# Patient Record
Sex: Male | Born: 1960 | Race: Black or African American | Hispanic: No | Marital: Married | Smoking: Current some day smoker
Health system: Southern US, Community
[De-identification: ages and names within clinical notes are randomized; demographics above are authoritative.]

## PROBLEM LIST (undated history)

## (undated) DIAGNOSIS — E785 Hyperlipidemia, unspecified: Secondary | ICD-10-CM

## (undated) DIAGNOSIS — E119 Type 2 diabetes mellitus without complications: Secondary | ICD-10-CM

## (undated) DIAGNOSIS — I10 Essential (primary) hypertension: Secondary | ICD-10-CM

## (undated) HISTORY — PX: ANKLE SURGERY: SHX546

---

## 2001-06-29 ENCOUNTER — Emergency Department (HOSPITAL_COMMUNITY): Admission: EM | Admit: 2001-06-29 | Discharge: 2001-06-29 | Payer: Self-pay | Admitting: *Deleted

## 2001-06-29 ENCOUNTER — Encounter: Payer: Self-pay | Admitting: *Deleted

## 2002-03-03 ENCOUNTER — Emergency Department (HOSPITAL_COMMUNITY): Admission: EM | Admit: 2002-03-03 | Discharge: 2002-03-03 | Payer: Self-pay | Admitting: Emergency Medicine

## 2002-03-03 ENCOUNTER — Encounter: Payer: Self-pay | Admitting: Emergency Medicine

## 2005-12-05 ENCOUNTER — Emergency Department: Payer: Self-pay | Admitting: Emergency Medicine

## 2008-02-10 ENCOUNTER — Emergency Department (HOSPITAL_COMMUNITY): Admission: EM | Admit: 2008-02-10 | Discharge: 2008-02-11 | Payer: Self-pay | Admitting: Emergency Medicine

## 2008-04-20 ENCOUNTER — Inpatient Hospital Stay: Payer: Self-pay | Admitting: Surgery

## 2008-08-23 ENCOUNTER — Emergency Department: Payer: Self-pay | Admitting: Emergency Medicine

## 2008-08-25 ENCOUNTER — Emergency Department: Payer: Self-pay | Admitting: Unknown Physician Specialty

## 2008-09-22 ENCOUNTER — Emergency Department: Payer: Self-pay | Admitting: Emergency Medicine

## 2008-12-20 ENCOUNTER — Emergency Department (HOSPITAL_COMMUNITY): Admission: EM | Admit: 2008-12-20 | Discharge: 2008-12-20 | Payer: Self-pay | Admitting: Emergency Medicine

## 2008-12-22 ENCOUNTER — Emergency Department (HOSPITAL_COMMUNITY): Admission: EM | Admit: 2008-12-22 | Discharge: 2008-12-22 | Payer: Self-pay | Admitting: Emergency Medicine

## 2009-01-31 ENCOUNTER — Emergency Department: Payer: Self-pay | Admitting: Emergency Medicine

## 2009-02-05 ENCOUNTER — Emergency Department: Payer: Self-pay | Admitting: Emergency Medicine

## 2009-04-27 ENCOUNTER — Emergency Department: Payer: Self-pay | Admitting: Unknown Physician Specialty

## 2010-06-22 LAB — CULTURE, ROUTINE-ABSCESS

## 2011-08-04 ENCOUNTER — Encounter (HOSPITAL_COMMUNITY): Payer: Self-pay | Admitting: *Deleted

## 2011-08-04 ENCOUNTER — Emergency Department (HOSPITAL_COMMUNITY)
Admission: EM | Admit: 2011-08-04 | Discharge: 2011-08-04 | Disposition: A | Discharge status: Home / Self Care | Payer: Self-pay | Attending: Emergency Medicine | Admitting: Emergency Medicine

## 2011-08-04 DIAGNOSIS — S2095XA Superficial foreign body of unspecified parts of thorax, initial encounter: Secondary | ICD-10-CM | POA: Insufficient documentation

## 2011-08-04 DIAGNOSIS — T148XXA Other injury of unspecified body region, initial encounter: Secondary | ICD-10-CM

## 2011-08-04 HISTORY — DX: Essential (primary) hypertension: I10

## 2011-08-04 MED ORDER — LIDOCAINE HCL (PF) 1 % IJ SOLN
INTRAMUSCULAR | Status: AC
  Administered 2011-08-04: 05:00:00
  Filled 2011-08-04: qty 5

## 2011-08-04 NOTE — ED Notes (Signed)
Pt alert & oriented x4, stable gait. Pt given discharge instructions, paperwork & prescription(s). Patient instructed to stop at the registration desk to finish any additional paperwork. pt verbalized understanding. Pt left department w/ no further questions.  

## 2011-08-04 NOTE — Discharge Instructions (Signed)
Wash your penis with warm soap and water, apply antibiotic cream to the tick bite site. See your doctor if you develop fevers, headache and a rash.

## 2011-08-04 NOTE — ED Notes (Signed)
Pt states woke this morning to find tick, pt removed wants to make sure he got all of it.

## 2011-08-04 NOTE — ED Provider Notes (Signed)
History     CSN: [id number]  Arrival date & time 08/04/11  0406   First MD Initiated Contact with Patient 08/04/11 [phone]      Chief Complaint  Patient presents with  . Insect Bite    (Consider location/radiation/quality/duration/timing/severity/associated sxs/prior treatment) HPI Comments: The patient awoke this morning finding a tick on the head of his penis. He is unsure when it latched on, he was able to pull off the body but there was some retained foreign body in the head of the penis. There is minimal pain associated with this, no fevers, symptoms are persistent, mild, nothing makes this better or worse.  The history is provided by the patient.    Past Medical History  Diagnosis Date  . Hypertension     Past Surgical History  Procedure Date  . Ankle surgery     left    History reviewed. No pertinent family history.  History  Substance Use Topics  . Smoking status: Current Some Day Smoker -- 0.5 packs/day    Types: Cigarettes  . Smokeless tobacco: Not on file  . Alcohol Use: No      Review of Systems  Constitutional: Negative for fever.  Skin: Negative for rash.    Allergies  Review of patient's allergies indicates no known allergies.  Home Medications   Current Outpatient Rx  Name Route Sig Dispense Refill  . ACETAMINOPHEN 325 MG PO TABS Oral Take 650 mg by mouth daily.    Marland Kitchen CETIRIZINE HCL 10 MG PO TABS Oral Take 10 mg by mouth daily.    Marland Kitchen FOLIC ACID 1 MG PO TABS Oral Take 1 mg by mouth daily.    . IBUPROFEN 800 MG PO TABS Oral Take 800 mg by mouth daily.    . MULTI-VITAMIN/MINERALS PO TABS Oral Take 1 tablet by mouth daily.    . THIAMINE HCL 50 MG PO TABS Oral Take 50 mg by mouth daily.      BP 140/85  Pulse 89  Temp(Src) 98.4 F (36.9 C) (Oral)  Resp 20  Ht 5\' 10"  (1.778 m)  Wt 250 lb (113.399 kg)  BMI 35.87 kg/m2  SpO2 95%  Physical Exam  Constitutional: He appears well-developed and well-nourished.  HENT:  Head: Normocephalic and  atraumatic.  Mouth/Throat: Oropharynx is clear and moist.  Eyes: Conjunctivae are normal. No scleral icterus.  Pulmonary/Chest: Effort normal.  Musculoskeletal: He exhibits no edema and no tenderness.  Neurological: He is alert. Coordination normal.  Skin: Skin is warm and dry. No rash noted.       The head of the penis shows a very small embedded insect head. No surrounding redness or swelling    ED Course  FOREIGN BODY REMOVAL Date/Time: 08/04/2011 4:50 AM Performed by: Eber Hong D Authorized by: Eber Hong D Consent: Verbal consent obtained. Risks and benefits: risks, benefits and alternatives were discussed Consent given by: patient Patient understanding: patient states understanding of the procedure being performed Patient identity confirmed: verbally with patient Time out: Immediately prior to procedure a "time out" was called to verify the correct patient, procedure, equipment, support staff and site/side marked as required. Intake: Head of the penis. Anesthesia: nerve block Local anesthetic: lidocaine 1% without epinephrine Anesthetic total: 4 ml Patient sedated: no Patient restrained: no Patient cooperative: yes Complexity: simple 1 objects recovered. Objects recovered: Tick head Post-procedure assessment: foreign body removed Patient tolerance: Patient tolerated the procedure well with no immediate complications.   (including critical care time)  Labs Reviewed -  No data to display No results found.   1. Foreign body in skin       MDM  Manual extraction of tick head required, see foreign body removal note above   Patient given followup instructions regarding possible [place] spotted fever or other tickborne illness, understanding expressed, cautioned to wash with warm soapy water twice today. Antibiotic cream and dressing.     Vida Roller, MD 08/04/11 [phone]

## 2014-04-24 ENCOUNTER — Emergency Department (HOSPITAL_COMMUNITY): Payer: Non-veteran care

## 2014-04-24 ENCOUNTER — Encounter (HOSPITAL_COMMUNITY): Payer: Self-pay | Admitting: Emergency Medicine

## 2014-04-24 ENCOUNTER — Emergency Department (HOSPITAL_COMMUNITY)
Admission: EM | Admit: 2014-04-24 | Discharge: 2014-04-24 | Disposition: A | Discharge status: Home / Self Care | Payer: Non-veteran care | Attending: Emergency Medicine | Admitting: Emergency Medicine

## 2014-04-24 DIAGNOSIS — I10 Essential (primary) hypertension: Secondary | ICD-10-CM | POA: Insufficient documentation

## 2014-04-24 DIAGNOSIS — J019 Acute sinusitis, unspecified: Secondary | ICD-10-CM | POA: Insufficient documentation

## 2014-04-24 DIAGNOSIS — Z7951 Long term (current) use of inhaled steroids: Secondary | ICD-10-CM | POA: Insufficient documentation

## 2014-04-24 DIAGNOSIS — Z7952 Long term (current) use of systemic steroids: Secondary | ICD-10-CM | POA: Insufficient documentation

## 2014-04-24 DIAGNOSIS — H9209 Otalgia, unspecified ear: Secondary | ICD-10-CM | POA: Insufficient documentation

## 2014-04-24 DIAGNOSIS — Z79899 Other long term (current) drug therapy: Secondary | ICD-10-CM | POA: Insufficient documentation

## 2014-04-24 DIAGNOSIS — Z720 Tobacco use: Secondary | ICD-10-CM | POA: Insufficient documentation

## 2014-04-24 DIAGNOSIS — J0191 Acute recurrent sinusitis, unspecified: Secondary | ICD-10-CM

## 2014-04-24 DIAGNOSIS — E119 Type 2 diabetes mellitus without complications: Secondary | ICD-10-CM | POA: Insufficient documentation

## 2014-04-24 DIAGNOSIS — R059 Cough, unspecified: Secondary | ICD-10-CM

## 2014-04-24 DIAGNOSIS — Z792 Long term (current) use of antibiotics: Secondary | ICD-10-CM | POA: Insufficient documentation

## 2014-04-24 DIAGNOSIS — R05 Cough: Secondary | ICD-10-CM

## 2014-04-24 DIAGNOSIS — R0602 Shortness of breath: Secondary | ICD-10-CM | POA: Insufficient documentation

## 2014-04-24 DIAGNOSIS — E785 Hyperlipidemia, unspecified: Secondary | ICD-10-CM | POA: Insufficient documentation

## 2014-04-24 HISTORY — DX: Hyperlipidemia, unspecified: E78.5

## 2014-04-24 HISTORY — DX: Type 2 diabetes mellitus without complications: E11.9

## 2014-04-24 MED ORDER — PREDNISONE 50 MG PO TABS
60.0000 mg | ORAL_TABLET | Freq: Once | ORAL | Status: AC
  Administered 2014-04-24: 60 mg via ORAL
  Filled 2014-04-24 (×2): qty 1

## 2014-04-24 MED ORDER — ALBUTEROL SULFATE (2.5 MG/3ML) 0.083% IN NEBU
2.5000 mg | INHALATION_SOLUTION | Freq: Once | RESPIRATORY_TRACT | Status: AC
  Administered 2014-04-24: 2.5 mg via RESPIRATORY_TRACT
  Filled 2014-04-24: qty 3

## 2014-04-24 MED ORDER — PREDNISONE 20 MG PO TABS: 20.0000 mg | ORAL_TABLET | Freq: Every day | ORAL | Status: AC

## 2014-04-24 MED ORDER — AMOXICILLIN 500 MG PO CAPS
500.0000 mg | ORAL_CAPSULE | Freq: Three times a day (TID) | ORAL | Status: AC
Start: 2014-04-24 — End: ?

## 2014-04-24 NOTE — Discharge Instructions (Signed)
Cough, Adult ° A cough is a reflex that helps clear your throat and airways. It can help heal the body or may be a reaction to an irritated airway. A cough may only last 2 or 3 weeks (acute) or may last more than 8 weeks (chronic).  °CAUSES °Acute cough: °· Viral or bacterial infections. °Chronic cough: °· Infections. °· Allergies. °· Asthma. °· Post-nasal drip. °· Smoking. °· Heartburn or acid reflux. °· Some medicines. °· Chronic lung problems (COPD). °· Cancer. °SYMPTOMS  °· Cough. °· Fever. °· Chest pain. °· Increased breathing rate. °· High-pitched whistling sound when breathing (wheezing). °· Colored mucus that you cough up (sputum). °TREATMENT  °· A bacterial cough may be treated with antibiotic medicine. °· A viral cough must run its course and will not respond to antibiotics. °· Your caregiver may recommend other treatments if you have a chronic cough. °HOME CARE INSTRUCTIONS  °· Only take over-the-counter or prescription medicines for pain, discomfort, or fever as directed by your caregiver. Use cough suppressants only as directed by your caregiver. °· Use a cold steam vaporizer or humidifier in your bedroom or home to help loosen secretions. °· Sleep in a semi-upright position if your cough is worse at night. °· Rest as needed. °· Stop smoking if you smoke. °SEEK IMMEDIATE MEDICAL CARE IF:  °· You have pus in your sputum. °· Your cough starts to worsen. °· You cannot control your cough with suppressants and are losing sleep. °· You begin coughing up blood. °· You have difficulty breathing. °· You develop pain which is getting worse or is uncontrolled with medicine. °· You have a fever. °MAKE SURE YOU:  °· Understand these instructions. °· Will watch your condition. °· Will get help right away if you are not doing well or get worse. °Document Released: 09/01/2010 Document Revised: 05/28/2011 Document Reviewed: 09/01/2010 °[employer]® Patient Information ©2015 [employer]. This information is not intended  to replace advice given to you by your health care provider. Make sure you discuss any questions you have with your health care provider. ° °Sinusitis °Sinusitis is redness, soreness, and puffiness (inflammation) of the air pockets in the bones of your face (sinuses). The redness, soreness, and puffiness can cause air and mucus to get trapped in your sinuses. This can allow germs to grow and cause an infection.  °HOME CARE  °· Drink enough fluids to keep your pee (urine) clear or pale yellow. °· Use a humidifier in your home. °· Run a hot shower to create steam in the bathroom. Sit in the bathroom with the door closed. Breathe in the steam 3-4 times a day. °· Put a warm, moist washcloth on your face 3-4 times a day, or as told by your doctor. °· Use salt water sprays (saline sprays) to wet the thick fluid in your nose. This can help the sinuses drain. °· Only take medicine as told by your doctor. °GET HELP RIGHT AWAY IF:  °· Your pain gets worse. °· You have very bad headaches. °· You are sick to your stomach (nauseous). °· You throw up (vomit). °· You are very sleepy (drowsy) all the time. °· Your face is puffy (swollen). °· Your vision changes. °· You have a stiff neck. °· You have trouble breathing. °MAKE SURE YOU:  °· Understand these instructions. °· Will watch your condition. °· Will get help right away if you are not doing well or get worse. °Document Released: 08/22/2007 Document Revised: 11/28/2011 Document Reviewed: 10/09/2011 °[employer]® Patient   Information ©2015 [employer]. This information is not intended to replace advice given to you by your health care provider. Make sure you discuss any questions you have with your health care provider. ° °

## 2014-04-24 NOTE — ED Notes (Signed)
Discharge instructions and prescriptions given to patient. Teach back used. No pain/complaints at time of discharge.

## 2014-04-24 NOTE — ED Provider Notes (Signed)
CSN: [id number]     Arrival date & time 04/24/14  1029 History  This chart was scribed for Rolland Porter, MD by Freida Busman, ED Scribe. This patient was seen in room [facility] and the patient's care was started 1:26 PM.  Chief Complaint  Patient presents with  . URI    The history is provided by the patient. No language interpreter was used.     HPI Comments:  Dwayne Willis is a 54 y.o. male who presents to the Emergency Department complaining of  productive cough with yellow sputum, congestion and sinus pressure. He also reports associated ear pain and  mild exertional SOB. He states he has been experiencing these symptoms intermittently for about 2 months and has a h/o the same. He has not been recently placed on antibiotics for his symptoms. No alleviating factors noted.  Past Medical History  Diagnosis Date  . Hypertension   . Diabetes mellitus without complication   . Hyperlipemia    Past Surgical History  Procedure Laterality Date  . Ankle surgery      left   No family history on file. History  Substance Use Topics  . Smoking status: Current Some Day Smoker -- 0.50 packs/day    Types: Cigarettes  . Smokeless tobacco: Not on file  . Alcohol Use: No    Review of Systems  Constitutional: Negative for fever, chills, diaphoresis, appetite change and fatigue.  HENT: Positive for ear pain and sinus pressure. Negative for mouth sores, sore throat and trouble swallowing.   Eyes: Negative for visual disturbance.  Respiratory: Positive for cough and shortness of breath. Negative for chest tightness and wheezing.   Cardiovascular: Negative for chest pain.  Gastrointestinal: Negative for nausea, vomiting, abdominal pain, diarrhea and abdominal distention.  Endocrine: Negative for polydipsia, polyphagia and polyuria.  Genitourinary: Negative for dysuria, frequency and hematuria.  Musculoskeletal: Negative for gait problem.  Skin: Negative for color change, pallor and rash.   Neurological: Negative for dizziness, syncope, light-headedness and headaches.  Hematological: Does not bruise/bleed easily.  Psychiatric/Behavioral: Negative for behavioral problems and confusion.  All other systems reviewed and are negative.     Allergies  Review of patient's allergies indicates no known allergies.  Home Medications   Prior to Admission medications   Medication Sig Start Date End Date Taking? Authorizing Provider  acetaminophen (TYLENOL) 325 MG tablet Take 650 mg by mouth every 6 (six) hours as needed for moderate pain or fever.    Yes Historical Provider, MD  albuterol (PROVENTIL HFA;VENTOLIN HFA) 108 (90 BASE) MCG/ACT inhaler Inhale 2 puffs into the lungs every 6 (six) hours as needed for wheezing or shortness of breath.   Yes Historical Provider, MD  atorvastatin (LIPITOR) 80 MG tablet Take 80 mg by mouth daily.   Yes Historical Provider, MD  benzonatate (TESSALON) 100 MG capsule Take 100 mg by mouth 2 (two) times daily as needed for cough.   Yes Historical Provider, MD  cetirizine (ZYRTEC) 10 MG tablet Take 10 mg by mouth daily.   Yes Historical Provider, MD  fluticasone (FLONASE) 50 MCG/ACT nasal spray Place 2 sprays into both nostrils daily.   Yes Historical Provider, MD  folic acid (FOLVITE) 1 MG tablet Take 1 mg by mouth daily.   Yes Historical Provider, MD  guaiFENesin (MUCINEX) 600 MG 12 hr tablet Take 1,200 mg by mouth 2 (two) times daily as needed for cough or to loosen phlegm.   Yes Historical Provider, MD  ibuprofen (ADVIL,MOTRIN) 800 MG tablet  Take 800 mg by mouth every 8 (eight) hours as needed for moderate pain.    Yes Historical Provider, MD  metFORMIN (GLUCOPHAGE) 500 MG tablet Take 500 mg by mouth 2 (two) times daily with a meal.   Yes Historical Provider, MD  Multiple Vitamins-Minerals (MULTIVITAMIN WITH MINERALS) tablet Take 1 tablet by mouth daily.   Yes Historical Provider, MD  pseudoephedrine (SUDAFED) 30 MG tablet Take 30 mg by mouth every 4  (four) hours as needed for congestion.   Yes Historical Provider, MD  amoxicillin (AMOXIL) 500 MG capsule Take 1 capsule (500 mg total) by mouth 3 (three) times daily. 04/24/14   Rolland PorterMark Wanetta Funderburke, MD  predniSONE (DELTASONE) 20 MG tablet Take 1 tablet (20 mg total) by mouth daily with breakfast. 04/24/14   Rolland PorterMark Brantley Wiley, MD   BP 134/93 mmHg  Pulse 74  Temp(Src) 98 F (36.7 C) (Oral)  Resp 18  Ht 5\' 10"  (1.778 m)  Wt 250 lb (113.399 kg)  BMI 35.87 kg/m2  SpO2 94% Physical Exam  Constitutional: He is oriented to person, place, and time. He appears well-developed and well-nourished. No distress.  HENT:  Head: Normocephalic.  Fluid behind TM bilaterally Congested nares  Eyes: Conjunctivae are normal. Pupils are equal, round, and reactive to light. No scleral icterus.  Neck: Normal range of motion. Neck supple. No thyromegaly present.  Cardiovascular: Normal rate and regular rhythm.  Exam reveals no gallop and no friction rub.   No murmur heard. Pulmonary/Chest: Effort normal. No respiratory distress. He has wheezes. He has no rales.  Expiratory wheezing; scattered both lungs  Abdominal: Soft. Bowel sounds are normal. He exhibits no distension. There is no tenderness. There is no rebound.  Musculoskeletal: Normal range of motion.  Neurological: He is alert and oriented to person, place, and time.  Skin: Skin is warm and dry. No rash noted.  Psychiatric: He has a normal mood and affect. His behavior is normal.  Nursing note and vitals reviewed.   ED Course  Procedures   DIAGNOSTIC STUDIES:  Oxygen Saturation is 96% on RA, normal by my interpretation.    COORDINATION OF CARE:  10:43 AM Will order CXR, breathing treatment and steroids. Discussed treatment plan with pt at bedside and pt agreed to plan.  Labs Review Labs Reviewed - No data to display  Imaging Review Dg Chest 2 View  04/24/2014   CLINICAL DATA:  Cough, congestion, shortness of breath  EXAM: CHEST  2 VIEW  COMPARISON:  None.   FINDINGS: Lungs are clear.  No pleural effusion or pneumothorax.  The heart is normal in size.  Degenerative changes of the visualized thoracolumbar spine.  IMPRESSION: No evidence of acute cardiopulmonary disease.   Electronically Signed   By: Charline BillsSriyesh  Krishnan M.D.   On: 04/24/2014 11:55     EKG Interpretation None      MDM   Final diagnoses:  Cough  Acute recurrent sinusitis, unspecified location   I personally performed the services described in this documentation, which was scribed in my presence. The recorded information has been reviewed and is accurate.  Chest x-ray shows no acute processes. Symptoms consistent with sinusitis, and bronchus spasm. Given by mouth prednisone, and nebulized albuterol and has improved. Lungs no increased aeration. Discharge home with prednisone, amoxicillin, continuing his current treatment for a sinus infection with his nasal steroid cough suppressants. She has    Rolland PorterMark Elsey Holts, MD 04/24/14 [phone]

## 2014-04-24 NOTE — ED Notes (Signed)
PT c/o nasal congestion with productive yellow sputum cough and some SOB on exertion for over 2 weeks. PT states was seen and started on 04/15/14 flonase, mucinex, cough medicine and sudafed. PT denies any CP or fever.

## 2015-12-17 IMAGING — DX DG CHEST 2V
2 series · 2 of 2 positions shown · non-contrast
Comparison: None.

CLINICAL DATA: Cough, congestion, shortness of breath

EXAM:
CHEST  2 VIEW

[chest lat]
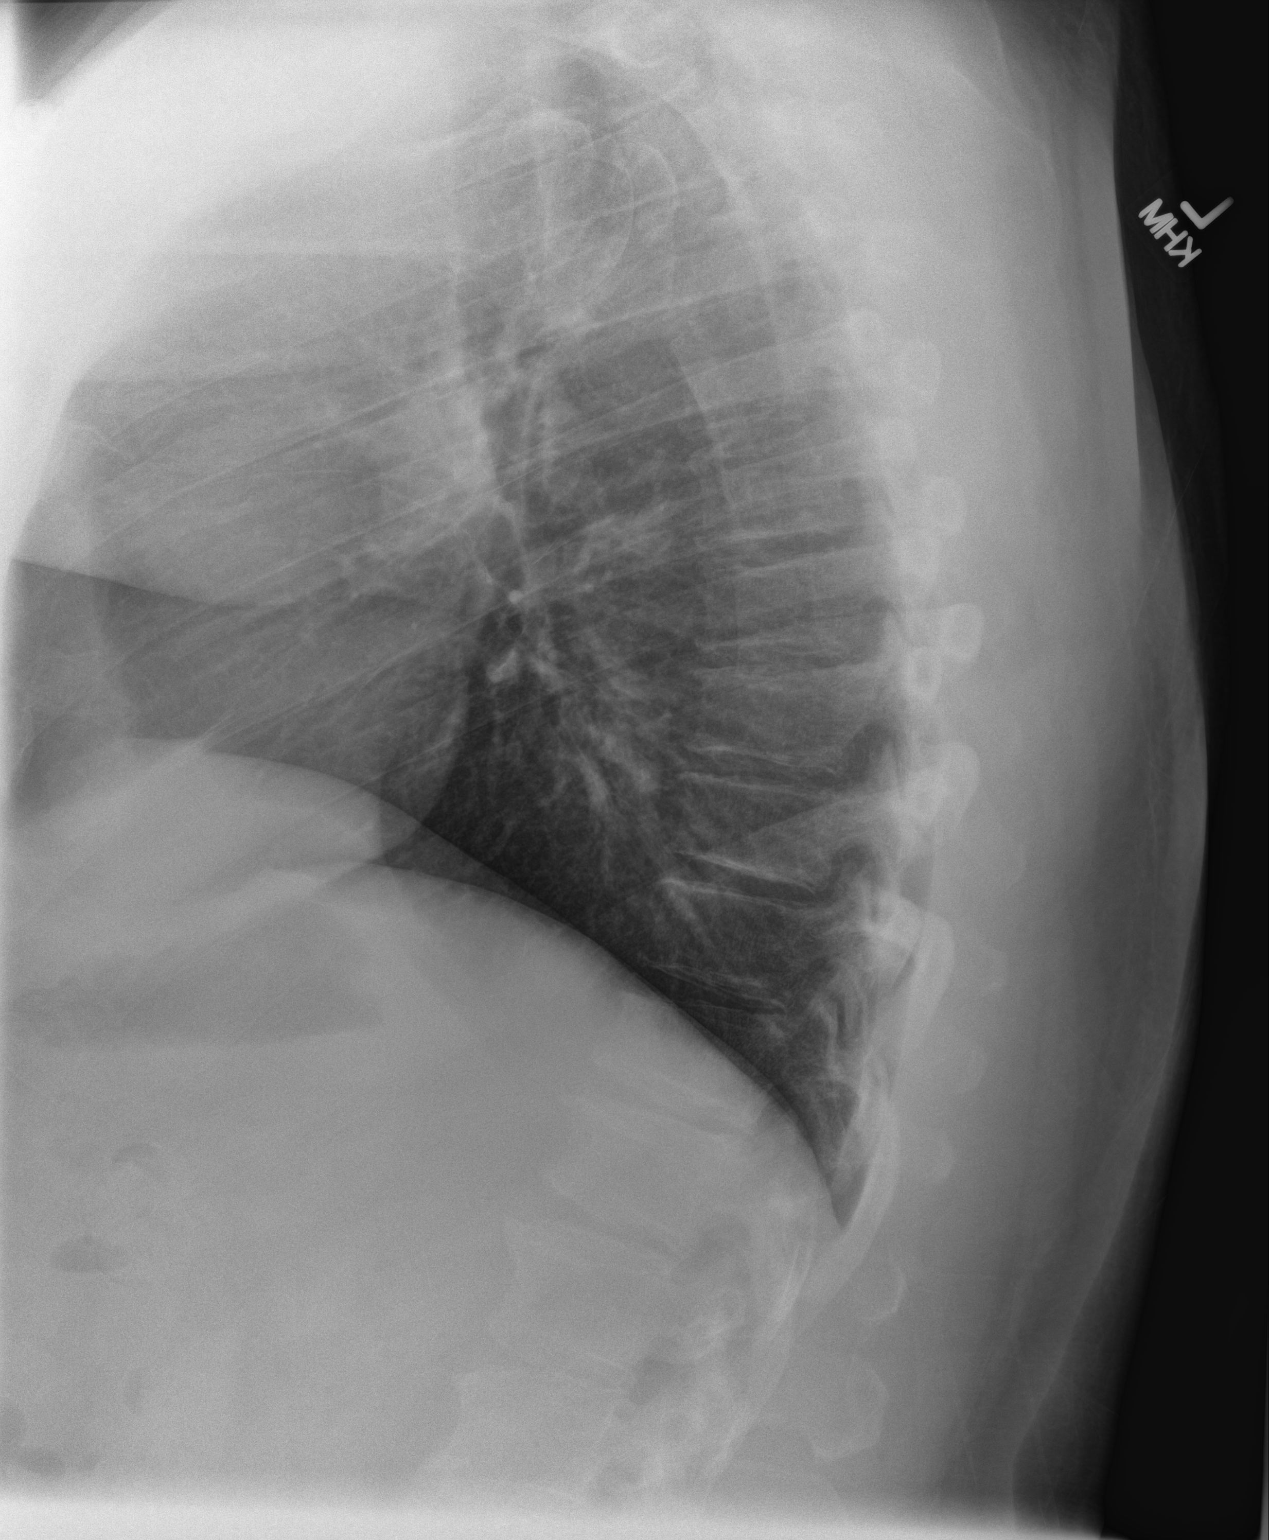

[chest pa]
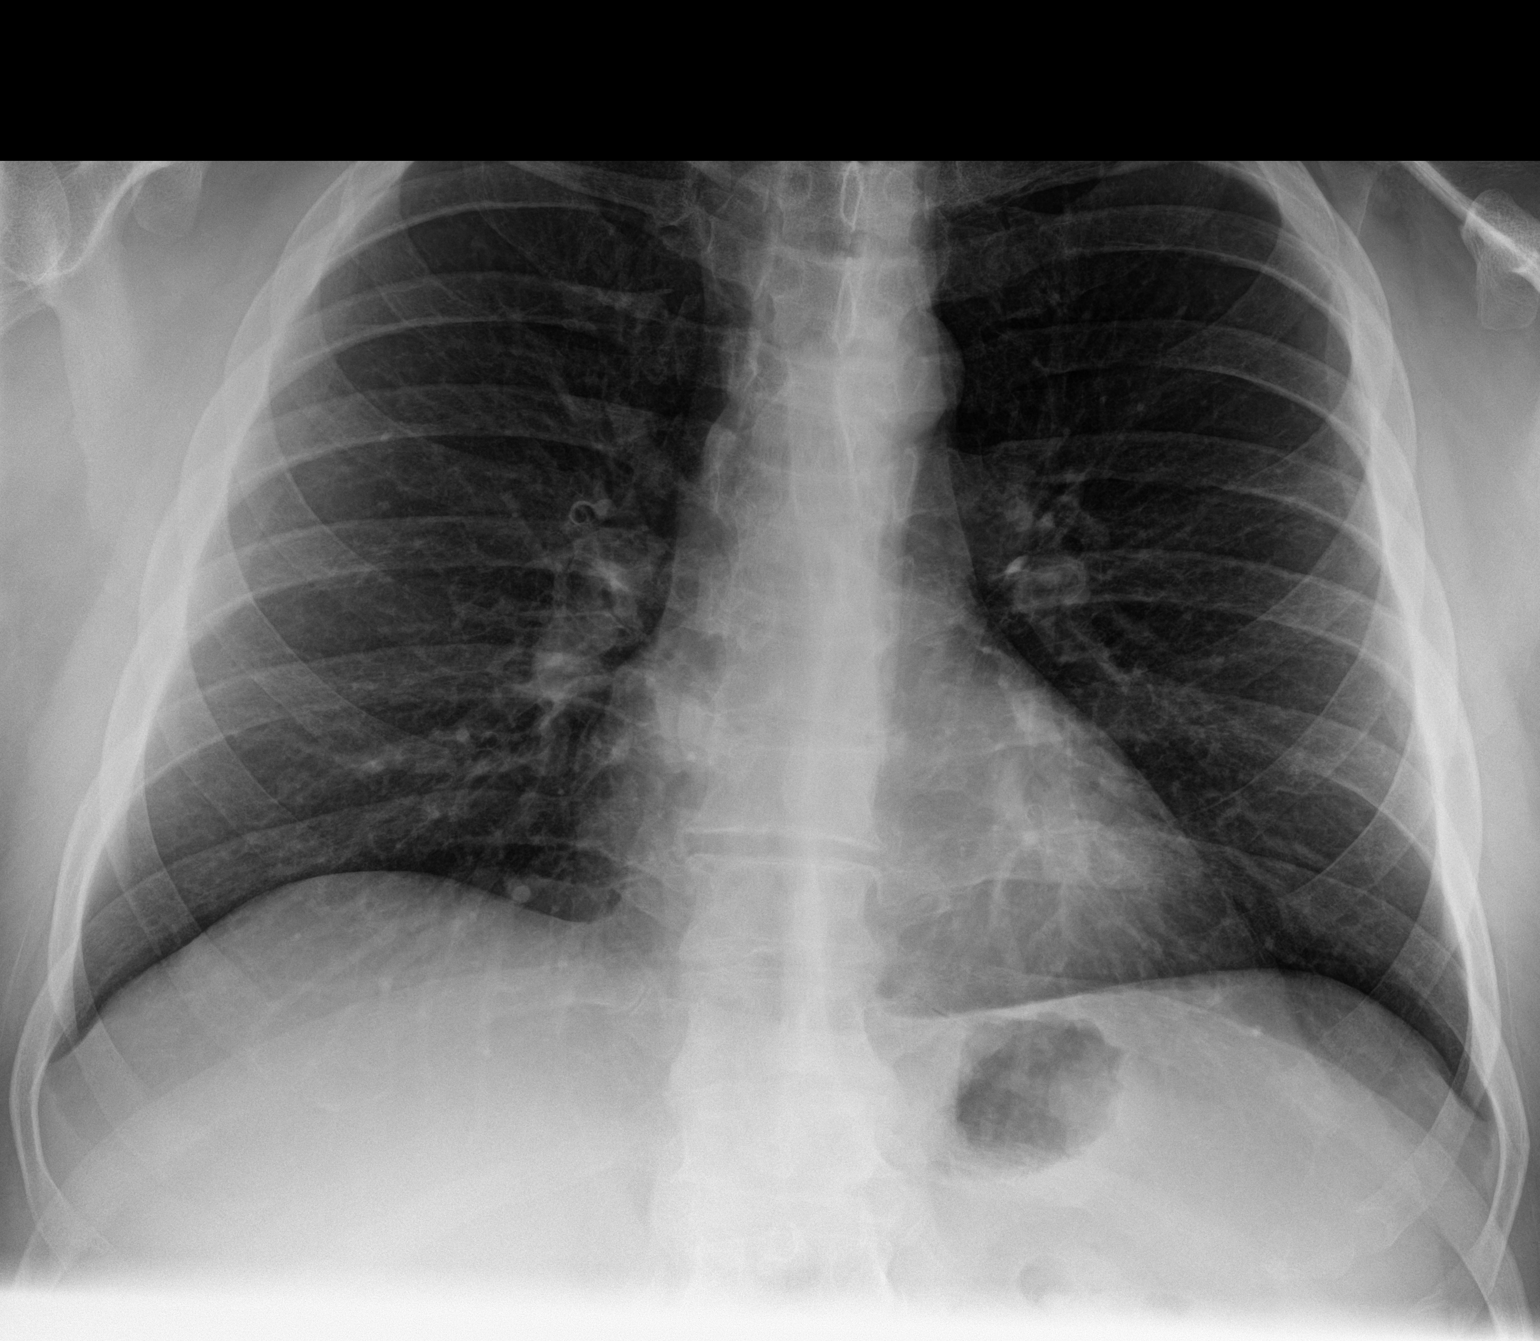

[2 of 2 positions shown; findings below may reference images not displayed]

FINDINGS: Lungs are clear.  No pleural effusion or pneumothorax.

The heart is normal in size.

Degenerative changes of the visualized thoracolumbar spine.
IMPRESSION: No evidence of acute cardiopulmonary disease.
